# Patient Record
Sex: Female | Born: 1945 | Race: White | Hispanic: No | Marital: Married
Health system: Southern US, Community
[De-identification: ages and names within clinical notes are randomized; demographics above are authoritative.]

---

## 2006-07-15 ENCOUNTER — Ambulatory Visit: Payer: Self-pay

## 2007-07-19 ENCOUNTER — Ambulatory Visit: Payer: Self-pay

## 2010-05-21 ENCOUNTER — Ambulatory Visit: Payer: Self-pay | Admitting: Family Medicine

## 2010-05-27 ENCOUNTER — Ambulatory Visit: Payer: Self-pay | Admitting: Emergency Medicine

## 2012-02-23 ENCOUNTER — Inpatient Hospital Stay: Payer: Self-pay | Admitting: Emergency Medicine

## 2012-02-23 LAB — CBC
HCT: 42.1 % (ref 35.0–47.0)
HGB: 14.5 g/dL (ref 12.0–16.0)
MCH: 31.1 pg (ref 26.0–34.0)
Platelet: 214 10*3/uL (ref 150–440)
RBC: 4.66 10*6/uL (ref 3.80–5.20)
RDW: 13 % (ref 11.5–14.5)

## 2012-02-23 LAB — URINALYSIS, COMPLETE
Bacteria: NONE SEEN
Blood: NEGATIVE
Glucose,UR: NEGATIVE mg/dL (ref 0–75)
Hyaline Cast: 2
Nitrite: NEGATIVE
Ph: 5 (ref 4.5–8.0)
Protein: 30
Specific Gravity: 1.027 (ref 1.003–1.030)
WBC UR: 2 /HPF (ref 0–5)

## 2012-02-23 LAB — CK TOTAL AND CKMB (NOT AT ARMC): CK-MB: 3.2 ng/mL (ref 0.5–3.6)

## 2012-02-23 LAB — COMPREHENSIVE METABOLIC PANEL
Albumin: 4.1 g/dL (ref 3.4–5.0)
Anion Gap: 9 (ref 7–16)
BUN: 20 mg/dL — ABNORMAL HIGH (ref 7–18)
Calcium, Total: 9 mg/dL (ref 8.5–10.1)
Chloride: 102 mmol/L (ref 98–107)
Co2: 29 mmol/L (ref 21–32)
Creatinine: 0.62 mg/dL (ref 0.60–1.30)
EGFR (African American): >60
Osmolality: 284 (ref 275–301)
Potassium: 3 mmol/L — ABNORMAL LOW (ref 3.5–5.1)
SGOT(AST): 35 U/L (ref 15–37)
SGPT (ALT): 33 U/L

## 2012-02-24 LAB — BASIC METABOLIC PANEL
Anion Gap: 10 (ref 7–16)
Calcium, Total: 7.9 mg/dL — ABNORMAL LOW (ref 8.5–10.1)
Chloride: 106 mmol/L (ref 98–107)
Co2: 26 mmol/L (ref 21–32)
Creatinine: 0.8 mg/dL (ref 0.60–1.30)
EGFR (African American): >60
Glucose: 123 mg/dL — ABNORMAL HIGH (ref 65–99)
Potassium: 3.1 mmol/L — ABNORMAL LOW (ref 3.5–5.1)
Sodium: 142 mmol/L (ref 136–145)

## 2012-02-24 LAB — CBC WITH DIFFERENTIAL/PLATELET
Basophil #: 0 10*3/uL (ref 0.0–0.1)
Basophil %: 0.3 %
Eosinophil #: 0 10*3/uL (ref 0.0–0.7)
Eosinophil %: 0.1 %
HGB: 12.6 g/dL (ref 12.0–16.0)
Lymphocyte %: 10.2 %
MCH: 30.8 pg (ref 26.0–34.0)
MCHC: 34.3 g/dL (ref 32.0–36.0)
Monocyte #: 1 x10 3/mm — ABNORMAL HIGH (ref 0.2–0.9)
Monocyte %: 8 %
Neutrophil #: 10.1 10*3/uL — ABNORMAL HIGH (ref 1.4–6.5)
Neutrophil %: 81.4 %

## 2012-02-25 LAB — CBC WITH DIFFERENTIAL/PLATELET
Basophil %: 0.3 %
Eosinophil %: 1.3 %
HCT: 33.2 % — ABNORMAL LOW (ref 35.0–47.0)
HGB: 11.5 g/dL — ABNORMAL LOW (ref 12.0–16.0)
Lymphocyte #: 1.4 10*3/uL (ref 1.0–3.6)
Lymphocyte %: 14.8 %
MCH: 31.3 pg (ref 26.0–34.0)
MCHC: 34.5 g/dL (ref 32.0–36.0)
MCV: 91 fL (ref 80–100)
Neutrophil #: 7 10*3/uL — ABNORMAL HIGH (ref 1.4–6.5)
Neutrophil %: 75.9 %
Platelet: 166 10*3/uL (ref 150–440)
RBC: 3.67 10*6/uL — ABNORMAL LOW (ref 3.80–5.20)
WBC: 9.2 10*3/uL (ref 3.6–11.0)

## 2012-02-25 LAB — BASIC METABOLIC PANEL
BUN: 9 mg/dL (ref 7–18)
Calcium, Total: 8.1 mg/dL — ABNORMAL LOW (ref 8.5–10.1)
Chloride: 113 mmol/L — ABNORMAL HIGH (ref 98–107)
Creatinine: 0.59 mg/dL — ABNORMAL LOW (ref 0.60–1.30)
EGFR (African American): >60
EGFR (Non-African Amer.): >60

## 2012-02-25 LAB — CLOSTRIDIUM DIFFICILE BY PCR

## 2012-02-26 LAB — POTASSIUM: Potassium: 4 mmol/L (ref 3.5–5.1)

## 2012-02-29 LAB — CULTURE, BLOOD (SINGLE)

## 2013-02-08 ENCOUNTER — Ambulatory Visit: Payer: Self-pay

## 2014-04-26 ENCOUNTER — Ambulatory Visit: Payer: Self-pay | Admitting: Family Medicine

## 2014-11-01 ENCOUNTER — Ambulatory Visit: Payer: Self-pay | Admitting: Family Medicine

## 2015-01-13 NOTE — H&P (Signed)
Subjective/Chief Complaint abdominal pain    History of Present Illness pt says rthar she deveolped pain in the right upper quadrent of abdomen for the last 24 hours.she never had this kind of pain before she takes cholesterol lowering medicine and something for her blood pressure.she has vomited at home all day .no bleeding with vomiting or with stools    Past History Hypertension,bladder and kidney infection,Cva Tia also stutters alot and looses the conversation in the middle of talk    Past Medical Health Hypertension   Past Med/Surgical Hx:  Hypercholesterolemia:   htn:   hiatal hernia:   ALLERGIES:  No Known Allergies:     Medications blood pressure pill and cholesterl pill.nurse going to call for medicine from husband   Family and Social History:   Family History Non-Contributory    Social History positive  tobacco, negative ETOH, negative Illicit drugs    + Tobacco Prior (greater than 1 year)    Place of Living Home   Review of Systems:   Fever/Chills No    Cough Yes    Sputum No    Abdominal Pain Yes    Diarrhea Yes    Constipation Yes    Nausea/Vomiting Yes    SOB/DOE No    Chest Pain No    Dysuria No    Tolerating Diet No  Nauseated    Medications/Allergies Reviewed Medications/Allergies reviewed   Physical Exam:   HEENT normal    NECK supple  thyroid not tender  trachea midline    RESP normal resp effort  crackles  some crepitations bilaterally    CARD regular rate  no murmur  no carotid bruits  No LE edema  no JVD    VASCULAR ACCESS none    ABD positive tenderness  no liver/spleen enlargement  no hernia  soft  tenderness right upper quadrent    LYMPH negative neck    EXTR negative cyanosis/clubbing, negative edema    SKIN normal to palpation, No rashes, No ulcers    NEURO alert but stuters alot son says she is like that    [facility] alert, poor insight   Lab Results: Hepatic:  04-Jun-13 12:46    Bilirubin, Total  1.2    Alkaline Phosphatase 111   SGPT (ALT) 33 (12-78 NOTE: NEW REFERENCE RANGE 08/14/2011)   SGOT (AST) 35   Total Protein, Serum 8.2   Albumin, Serum 4.1  Routine Chem:  04-Jun-13 11:26    Lipase 113 (Result(s) reported on 23 Feb 2012 at 01:34PM.)    12:46    Glucose, Serum  131   BUN  20   Creatinine (comp) 0.62   Sodium, Serum 140   Potassium, Serum  3.0   Chloride, Serum 102   CO2, Serum 29   Calcium (Total), Serum 9.0   Osmolality (calc) 284   eGFR (African American) >60   eGFR (Non-African American) >60 (eGFR values <65m/min/1.73 m2 may be an indication of chronic kidney disease (CKD). Calculated eGFR is useful in patients with stable renal function. The eGFR calculation will not be reliable in acutely ill patients when serum creatinine is changing rapidly. It is not useful in  patients on dialysis. The eGFR calculation may not be applicable to patients at the low and high extremes of body sizes, pregnant women, and vegetarians.)   Anion Gap 9  Cardiac:  04-Jun-13 12:46    CK, Total  229   CPK-MB, Serum 3.2 (Result(s) reported on 23 Feb 2012 at 12:28PM.)  Troponin I < 0.02 (0.00-0.05 0.05 ng/mL or less: NEGATIVE  Repeat testing in 3-6 hrs  if clinically indicated. >0.05 ng/mL: POTENTIAL  MYOCARDIAL INJURY. Repeat  testing in 3-6 hrs if  clinically indicated. NOTE: An increase or decrease  of 30% or more on serial  testing suggests a  clinically important change)  Routine UA:  04-Jun-13 11:26    Color (UA) Yellow   Clarity (UA) Cloudy   Glucose (UA) Negative   Bilirubin (UA) Negative   Ketones (UA) Trace   Specific Gravity (UA) 1.027   Blood (UA) Negative   pH (UA) 5.0   Protein (UA) 30 mg/dL   Nitrite (UA) Negative   Leukocyte Esterase (UA) Trace (Result(s) reported on 23 Feb 2012 at 12:13PM.)   RBC (UA) 4 /HPF   WBC (UA) 2 /HPF   Bacteria (UA) NONE SEEN   Epithelial Cells (UA) 2 /HPF   Mucous (UA) PRESENT   Hyaline Cast (UA) 2 /LPF   Amorphous  Crystal (UA) PRESENT (Result(s) reported on 23 Feb 2012 at 12:13PM.)  Routine Hem:  04-Jun-13 12:46    WBC (CBC)  17.9   RBC (CBC) 4.66   Hemoglobin (CBC) 14.5   Hematocrit (CBC) 42.1   Platelet Count (CBC) 214 (Result(s) reported on 23 Feb 2012 at 12:59PM.)   MCV 90   MCH 31.1   MCHC 34.4   RDW 13.0     Assessment/Admission Diagnosis i have talked to radiologist and looked at the CT scan and Discussed the findings .The ct scan shows inflamation around the gall bladder area but gall bladder ultrasound is normal.i do not think it is cholysystitis and i think it is probablt diverticulitis of hepatic flexture    Plan will treat her conservatively with antibiotics ans see how she progresses   Electronic Signatures: Vella Kohler (MD)  (Signed 04-Jun-13 21:44)  Authored: CHIEF COMPLAINT and HISTORY, PAST MEDICAL/SURGIAL HISTORY, ALLERGIES, OTHER MEDICATIONS, FAMILY AND SOCIAL HISTORY, REVIEW OF SYSTEMS, PHYSICAL EXAM, LABS, ASSESSMENT AND PLAN   Last Updated: 04-Jun-13 21:44 by Vella Kohler (MD)

## 2015-01-13 NOTE — Consult Note (Signed)
Chief Complaint:   Subjective/Chief Complaint pt is having diarrhoea today   VITAL SIGNS/ANCILLARY NOTES: **Vital Signs.:   06-Jun-13 06:08   Vital Signs Type Routine   Temperature Temperature (F) 98.3   Celsius 36.8   Temperature Source oral   Pulse Pulse 91   Pulse source per vital sign device   Respirations Respirations 22   Systolic BP Systolic BP 003   Diastolic BP (mmHg) Diastolic BP (mmHg) 76   Mean BP 95   BP Source vital sign device   Pulse Ox % Pulse Ox % 93   Pulse Ox Activity Level  At rest   Oxygen Delivery Room Air/ 21 %  *Intake and Output.:   06-Jun-13 06:51   Grand Totals Intake:  1540 Output:      Net:  7048 88 Hr.:  9169   IV (Primary)      In:  1440   IV (Secondary)      In:  100    Shift 07:00   Grand Totals Intake:  1540 Output:      Net:  4503 88 Hr.:  8280   IV (Primary)      In:  1440   IV (Secondary)      In:  100   Length of Stay Totals Intake:  7960 Output:      Net:  7960    Daily 07:00   Grand Totals Intake:  5810 Output:      Net:  0349 17 Hr.:  9150   IV (Primary)      In:  5082   IV (Secondary)      In:  728   Length of Stay Totals Intake:  7960 Output:      Net:  5697    09:54   Grand Totals Intake:   Output:      Net:   36 Hr.:     Stool  x2 small amount, yellowish-brown, watery   Brief Assessment:   Cardiac Regular    Respiratory normal resp effort    Gastrointestinal Normal    Gastrointestinal details normal Soft  Nontender  No masses palpable  Bowel sounds normal  No rebound tenderness  No gaurding  No rigidity  No organomegaly    Additional Physical Exam pt was put in isolation because of possible c dificille .   Lab Results: Hepatic:  04-Jun-13 12:46    Bilirubin, Total  1.2   Alkaline Phosphatase 111   SGPT (ALT) 33 (12-78 NOTE: NEW REFERENCE RANGE 08/14/2011)   SGOT (AST) 35   Total Protein, Serum 8.2   Albumin, Serum 4.1  Routine Micro:  04-Jun-13 22:26    Micro Text Report BLOOD CULTURE  COMMENT                   NO GROWTH IN 36 HOURS   ANTIBIOTIC                        Culture Comment NO GROWTH IN 36 HOURS  Result(s) reported on 25 Feb 2012 at 06:24AM.  Cardiology:  04-Jun-13 14:30    Ventricular Rate 81   Atrial Rate 81   P-R Interval 146   QRS Duration 90   QT 366   QTc 425   P Axis 57   R Axis 28   T Axis 50   ECG interpretation Normal sinus rhythm Nonspecific T wave abnormality Abnormal ECG No previous ECGs available ----------unconfirmed---------- Confirmed by OVERREAD, NOT (  [id number]), editor PEARSON, BARBARA ([id number]) on 02/24/2012 3:35:51 PM  Routine Chem:  04-Jun-13 11:26    Lipase 113 (Result(s) reported on 23 Feb 2012 at 01:34PM.)    12:46    Glucose, Serum  131   BUN  20   Creatinine (comp) 0.62   Sodium, Serum 140   Potassium, Serum  3.0   Chloride, Serum 102   CO2, Serum 29   Calcium (Total), Serum 9.0   Anion Gap 9   Osmolality (calc) 284   eGFR (African American) >60   eGFR (Non-African American) >60 (eGFR values <74m/min/1.73 m2 may be an indication of chronic kidney disease (CKD). Calculated eGFR is useful in patients with stable renal function. The eGFR calculation will not be reliable in acutely ill patients when serum creatinine is changing rapidly. It is not useful in  patients on dialysis. The eGFR calculation may not be applicable to patients at the low and high extremes of body sizes, pregnant women, and vegetarians.)  05-Jun-13 04:42    Glucose, Serum  123   BUN 15   Creatinine (comp) 0.80   Sodium, Serum 142   Potassium, Serum  3.1   Chloride, Serum 106   CO2, Serum 26   Calcium (Total), Serum  7.9   Anion Gap 10   Osmolality (calc) 285   eGFR (African American) >60   eGFR (Non-African American) >60 (eGFR values <615mmin/1.73 m2 may be an indication of chronic kidney disease (CKD). Calculated eGFR is useful in patients with stable renal function. The eGFR calculation will not be reliable in acutely ill patients when serum  creatinine is changing rapidly. It is not useful in  patients on dialysis. The eGFR calculation may not be applicable to patients at the low and high extremes of body sizes, pregnant women, and vegetarians.)    19:08    Potassium, Serum  3.1 (Result(s) reported on 24 Feb 2012 at 08:08PM.)  06-Jun-13 04:42    Glucose, Serum 86   BUN 9   Creatinine (comp)  0.59   Sodium, Serum 145   Potassium, Serum 4.0   Chloride, Serum  113   CO2, Serum 23   Calcium (Total), Serum  8.1   Anion Gap 9   Osmolality (calc) 287   eGFR (African American) >60   eGFR (Non-African American) >60 (eGFR values <6070min/1.73 m2 may be an indication of chronic kidney disease (CKD). Calculated eGFR is useful in patients with stable renal function. The eGFR calculation will not be reliable in acutely ill patients when serum creatinine is changing rapidly. It is not useful in  patients on dialysis. The eGFR calculation may not be applicable to patients at the low and high extremes of body sizes, pregnant women, and vegetarians.)  Cardiac:  04-Jun-13 12:46    CK, Total  229   CPK-MB, Serum 3.2 (Result(s) reported on 23 Feb 2012 at 12:28PM.)   Troponin I < 0.02 (0.00-0.05 0.05 ng/mL or less: NEGATIVE  Repeat testing in 3-6 hrs  if clinically indicated. >0.05 ng/mL: POTENTIAL  MYOCARDIAL INJURY. Repeat  testing in 3-6 hrs if  clinically indicated. NOTE: An increase or decrease  of 30% or more on serial  testing suggests a  clinically important change)  Routine UA:  04-Jun-13 11:26    Color (UA) Yellow   Clarity (UA) Cloudy   Glucose (UA) Negative   Bilirubin (UA) Negative   Ketones (UA) Trace   Specific Gravity (UA) 1.027   Blood (UA) Negative   pH (UA) 5.0  Protein (UA) 30 mg/dL   Nitrite (UA) Negative   Leukocyte Esterase (UA) Trace (Result(s) reported on 23 Feb 2012 at 12:13PM.)   RBC (UA) 4 /HPF   WBC (UA) 2 /HPF   Bacteria (UA) NONE SEEN   Epithelial Cells (UA) 2 /HPF   Mucous (UA)  PRESENT   Hyaline Cast (UA) 2 /LPF   Amorphous Crystal (UA) PRESENT (Result(s) reported on 23 Feb 2012 at 12:13PM.)  Routine Hem:  04-Jun-13 12:46    WBC (CBC)  17.9   RBC (CBC) 4.66   Hemoglobin (CBC) 14.5   Hematocrit (CBC) 42.1   Platelet Count (CBC) 214 (Result(s) reported on 23 Feb 2012 at 12:59PM.)   MCV 90   MCH 31.1   MCHC 34.4   RDW 13.0  05-Jun-13 04:42    WBC (CBC)  12.4   RBC (CBC) 4.07   Hemoglobin (CBC) 12.6   Hematocrit (CBC) 36.6   Platelet Count (CBC) 197   MCV 90   MCH 30.8   MCHC 34.3   RDW 13.0   Neutrophil % 81.4   Lymphocyte % 10.2   Monocyte % 8.0   Eosinophil % 0.1   Basophil % 0.3   Neutrophil #  10.1   Lymphocyte # 1.3   Monocyte #  1.0   Eosinophil # 0.0   Basophil # 0.0 (Result(s) reported on 24 Feb 2012 at 05:18AM.)  06-Jun-13 04:42    WBC (CBC) 9.2   RBC (CBC)  3.67   Hemoglobin (CBC)  11.5   Hematocrit (CBC)  33.2   Platelet Count (CBC) 166   MCV 91   MCH 31.3   MCHC 34.5   RDW 13.4   Neutrophil % 75.9   Lymphocyte % 14.8   Monocyte % 7.7   Eosinophil % 1.3   Basophil % 0.3   Neutrophil #  7.0   Lymphocyte # 1.4   Monocyte # 0.7   Eosinophil # 0.1   Basophil # 0.0 (Result(s) reported on 25 Feb 2012 at 05:28AM.)   Assessment/Plan:  Assessment/Plan:   Plan will conrtinue same treatment till culture come back WBC back to normal   Electronic Signatures: Vella Kohler (MD)  (Signed 06-Jun-13 12:38)  Authored: Chief Complaint, VITAL SIGNS/ANCILLARY NOTES, Brief Assessment, Lab Results, Assessment/Plan   Last Updated: 06-Jun-13 12:38 by Vella Kohler (MD)

## 2015-01-13 NOTE — Consult Note (Signed)
PATIENT NAMMarland Weaver:  [name], Sheena MR#:  [id number] DATE OF BIRTH:  06-02-1946  DATE OF CONSULTATION:  02/24/2012  REFERRING PHYSICIAN:  Dr. Cecelia ByarsHashmi  CONSULTING PHYSICIAN:  Dietrick Barris H. Allena KatzPatel, MD  PRIMARY MD: Ladona RidgelJarrod D. Kanady, FNP  REASON FOR CONSULTATION: Opinion regarding the patient's hypokalemia, hypertension, and hyperlipidemia.   CHIEF COMPLAINT: Abdominal pain.   HISTORY OF PRESENT ILLNESS: The patient is a 69 year old white female with history of hypertension and elevated cholesterol who presents with complaint of having right upper chest pain as well as right upper quadrant abdominal pain as well as right lower quadrant abdominal pain ongoing for 24 hours prior to admission. The patient was seen in the ED and had evaluation. She was noted to have an elevated WBC count with a CT scan suggestive of initial inflammation around the gallbladder. However, right upper quadrant ultrasound failed to show any gallbladder wall thickening. It is felt that she likely has either colitis or diverticulitis involving the hepatic flexure. The patient reports that her pain is improved. She also was nauseous yesterday and had some throwing up. All these symptoms have improved. She denies any fevers or chills at home. She denies any left-sided chest pain. She does report similar type of episode previously. Denies any urinary frequency, urgency, or hesitancy. Denies any headaches or photophobia.   PAST MEDICAL HISTORY:  1. Hypertension.  2. Hyperlipidemia.  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  4. History of hiatal hernia.   ALLERGIES: None.   SOCIAL HISTORY: Does not smoke. Does not drink. No drugs.   FAMILY HISTORY: Positive for hypertension.   REVIEW OF SYSTEMS: CONSTITUTIONAL: Denies any fevers, fatigue, weakness. Presented with pain in the abdomen. No weight loss. No weight gain. EYES: No blurred or double vision. No pain. No redness. No inflammation. No glaucoma. No cataracts. ENT: No tinnitus. No ear pain. No  hearing loss. No epistaxis. No nasal discharge. RESPIRATORY: No cough. No wheezing. No hemoptysis. No COPD. No TB. CARDIOVASCULAR: Denies any left-sided chest pain. No orthopnea. No edema. Does have high blood pressure. GI: Nausea and vomiting as above. Abdominal pain as above. No hematemesis. No melena. No gastroesophageal reflux disease. No IBS. GU: Denies any dysuria, hematuria, renal calculus, or frequency. ENDOCRINE: Denies any polyuria, nocturia, or thyroid problems. Denies any increase in sweating, heat or cold intolerance. HEME/LYMPH: Denies anemia, easy bruisability, or bleeding. SKIN: No acne. No rash. No change in mole, hair or skin. MUSCULOSKELETAL: Denies any pain in the neck, back, or shoulder. NEUROLOGIC: No numbness. No weakness. No CVA. No TIA. Has chronic stuttering. PSYCHIATRIC: Denies any anxiety, insomnia, or ADD.   PHYSICAL EXAMINATION:   VITAL SIGNS: Temperature 98.1, pulse 74, respirations 18, blood pressure 111/70, O2 95%.   GENERAL: The patient appears comfortable in no acute distress.   HEENT: Pupils equally round and reactive to light and accommodation. Extraocular movements intact. There is no conjunctival pallor. No scleral icterus. Nasal exam shows no ulceration or drainage. Oropharynx is clear without any exudates.   NECK: No thyromegaly. No carotid bruits.   CARDIOVASCULAR: Regular rate and rhythm. No murmurs, rubs, clicks, or gallops. PMI is not displaced.   LUNGS: Clear to auscultation bilaterally without any rales, rhonchi, or wheezing.   ABDOMEN: There is mild distention without any guarding. No rebound. Positive diminished breath sounds. No hepatosplenomegaly.   EXTREMITIES: No clubbing, cyanosis, or edema.   SKIN: No rash.   LYMPHATICS: No lymph nodes palpable.   VASCULAR: Good DP, PT pulses.   PSYCHIATRIC: Not anxious or  depressed.  NEUROLOGIC: Awake, alert, oriented x3. No focal deficits.   LABORATORY, DIAGNOSTIC, AND RADIOLOGICAL DATA: WBC today  12.4, down from 17.9 yesterday, hemoglobin 12.6, platelets 197, glucose 123, BUN 15, creatinine 0.80, sodium 142, potassium 3.1, chloride 106, CO2 26, calcium 7.9.   CT scan of the abdomen shows mild distention of the gallbladder with mild stranding adjacent to gallbladder and adjacent hepatic flexures. Small amount of free intraperitoneal fluid. Small air noted in the gallbladder. Endometrial thickening and endometrial process such as endometrial cancer cannot be excluded.  Right upper quadrant ultrasound shows no gallstones or other acute changes noted.   CPK 229. CK-MB 3.2. Troponin less than 0.02. Lipase 113.   ASSESSMENT AND PLAN: The patient is a 69 year old white female admitted with abdominal pain for 24 hours for possible colitis, hepatic flexure diverticulitis. We are asked to see patient regarding hypertension, elevated cholesterol, hypokalemia.  1. Hypokalemia. Has gotten IV KCl. Will repeat potassium later today. If low, will replace. Likely due to HCTZ therapy as well as some nausea and vomiting. Will repeat BMP in the a.m.  2. Hypertension. Blood pressure is currently normal. Will hold HCTZ.  3. Hyperlipidemia. Hold simvastatin until able to take adequate p.o.  4. Endometrial thickening. Recommend outpatient GYN evaluation.  5. Miscellaneous. Recommend incentive spirometry and DVT prophylaxis while in the hospital.   TIME SPENT: 40 minutes.   ____________________________ Lacie Scotts Allena Katz, MD shp:drc D: 02/24/2012 12:47:00 ET T: 02/24/2012 13:12:59 ET JOB#: [id number] cc: Richanda Darin H. Allena Katz, MD, <Dictator>, Ladona Ridgel, FNP Charise Carwin MD ELECTRONICALLY SIGNED 02/29/2012 8:03

## 2015-01-13 NOTE — Consult Note (Signed)
Chief Complaint:   Subjective/Chief Complaint feeling better   VITAL SIGNS/ANCILLARY NOTES: **Vital Signs.:   05-Jun-13 01:23   Vital Signs Type Q 4hr   Temperature Temperature (F) 99.2   Celsius 37.3   Temperature Source oral   Pulse Pulse 88   Pulse source per Dinamap   Respirations Respirations 18   Systolic BP Systolic BP 809   Diastolic BP (mmHg) Diastolic BP (mmHg) 67   Mean BP 78   BP Source vital sign device   Pulse Ox % Pulse Ox % 90   Pulse Ox Activity Level  At rest   Oxygen Delivery Room Air/ 21 %    05:45   Vital Signs Type Routine   Temperature Temperature (F) 99.8   Celsius 37.6   Temperature Source oral   Pulse Pulse 102   Pulse source per Dinamap   Respirations Respirations 18   Systolic BP Systolic BP 983   Diastolic BP (mmHg) Diastolic BP (mmHg) 70   Mean BP 86   BP Source vital sign device   Pulse Ox % Pulse Ox % 90   Pulse Ox Activity Level  At rest   Oxygen Delivery Room Air/ 21 %    09:44   Temperature Temperature (F) 98.1   Celsius 36.7   Temperature Source oral   Pulse Pulse 74   Respirations Respirations 18   Systolic BP Systolic BP 382   Diastolic BP (mmHg) Diastolic BP (mmHg) 70   Mean BP 83   Pulse Ox % Pulse Ox % 95   Pulse Ox Activity Level  At rest   Oxygen Delivery Room Air/ 21 %    14:50   Temperature Temperature (F) 98.4   Celsius 36.8   Temperature Source oral   Pulse Pulse 79   Respirations Respirations 20   Systolic BP Systolic BP 505   Diastolic BP (mmHg) Diastolic BP (mmHg) 71   Mean BP 84   Pulse Ox % Pulse Ox % 92   Pulse Ox Activity Level  At rest   Oxygen Delivery Room Air/ 21 %  *Intake and Output.:   05-Jun-13 06:50   Grand Totals Intake:  2150 Output:      Net:  2150 24 Hr.:  2150   IV (Primary)      In:  2050   IV (Secondary)      In:  100    Shift 07:00   Grand Totals Intake:  2150 Output:      Net:  2150 24 Hr.:  2150   IV (Primary)      In:  2050   IV (Secondary)      In:  100   Length of  Stay Totals Intake:  2150 Output:      Net:  2150    Daily 07:00   Grand Totals Intake:  2150 Output:      Net:  2150 24 Hr.:  2150   IV (Primary)      In:  2050   IV (Secondary)      In:  100   Length of Stay Totals Intake:  2150 Output:      Net:  2150    13:18   Grand Totals Intake:   Output:      Net:   82 Hr.:     Urinary Method  Void; Up to BR   Unmeasured Output  Void; X2 per pt   Brief Assessment:   Cardiac Regular    Respiratory normal  resp effort    Gastrointestinal Normal    Gastrointestinal details normal Soft  No masses palpable  Bowel sounds normal  No rebound tenderness  No gaurding  No rigidity    Additional Physical Exam feeling much better hungry wants some fluids   Lab Results: Hepatic:  04-Jun-13 12:46    Bilirubin, Total  1.2   Alkaline Phosphatase 111   SGPT (ALT) 33 (12-78 NOTE: NEW REFERENCE RANGE 08/14/2011)   SGOT (AST) 35   Total Protein, Serum 8.2   Albumin, Serum 4.1  Routine Micro:  04-Jun-13 22:26    Micro Text Report BLOOD CULTURE   COMMENT                   NO GROWTH IN 8-12 HOURS   ANTIBIOTIC                        Culture Comment NO GROWTH IN 8-12 HOURS  Result(s) reported on 24 Feb 2012 at 07:43AM.  Cardiology:  04-Jun-13 14:30    Ventricular Rate 81   Atrial Rate 81   P-R Interval 146   QRS Duration 90   QT 366   QTc 425   P Axis 57   R Axis 28   T Axis 50   ECG interpretation Normal sinus rhythm Nonspecific T wave abnormality Abnormal ECG No previous ECGs available ----------unconfirmed---------- Confirmed by OVERREAD, NOT ([id number]), editor PEARSON, BARBARA ([id number]) on 02/24/2012 3:35:51 PM  Routine Chem:  04-Jun-13 11:26    Lipase 113 (Result(s) reported on 23 Feb 2012 at 01:34PM.)    12:46    Glucose, Serum  131   BUN  20   Creatinine (comp) 0.62   Sodium, Serum 140   Potassium, Serum  3.0   Chloride, Serum 102   CO2, Serum 29   Calcium (Total), Serum 9.0   Anion Gap 9   Osmolality (calc) 284   eGFR  (African American) >60   eGFR (Non-African American) >60 (eGFR values <35m/min/1.73 m2 may be an indication of chronic kidney disease (CKD). Calculated eGFR is useful in patients with stable renal function. The eGFR calculation will not be reliable in acutely ill patients when serum creatinine is changing rapidly. It is not useful in  patients on dialysis. The eGFR calculation may not be applicable to patients at the low and high extremes of body sizes, pregnant women, and vegetarians.)  05-Jun-13 04:42    Glucose, Serum  123   BUN 15   Creatinine (comp) 0.80   Sodium, Serum 142   Potassium, Serum  3.1   Chloride, Serum 106   CO2, Serum 26   Calcium (Total), Serum  7.9   Anion Gap 10   Osmolality (calc) 285   eGFR (African American) >60   eGFR (Non-African American) >60 (eGFR values <660mmin/1.73 m2 may be an indication of chronic kidney disease (CKD). Calculated eGFR is useful in patients with stable renal function. The eGFR calculation will not be reliable in acutely ill patients when serum creatinine is changing rapidly. It is not useful in  patients on dialysis. The eGFR calculation may not be applicable to patients at the low and high extremes of body sizes, pregnant women, and vegetarians.)  Cardiac:  04-Jun-13 12:46    CK, Total  229   CPK-MB, Serum 3.2 (Result(s) reported on 23 Feb 2012 at 12:28PM.)   Troponin I < 0.02 (0.00-0.05 0.05 ng/mL or less: NEGATIVE  Repeat testing in 3-6 hrs  if  clinically indicated. >0.05 ng/mL: POTENTIAL  MYOCARDIAL INJURY. Repeat  testing in 3-6 hrs if  clinically indicated. NOTE: An increase or decrease  of 30% or more on serial  testing suggests a  clinically important change)  Routine UA:  04-Jun-13 11:26    Color (UA) Yellow   Clarity (UA) Cloudy   Glucose (UA) Negative   Bilirubin (UA) Negative   Ketones (UA) Trace   Specific Gravity (UA) 1.027   Blood (UA) Negative   pH (UA) 5.0   Protein (UA) 30 mg/dL   Nitrite  (UA) Negative   Leukocyte Esterase (UA) Trace (Result(s) reported on 23 Feb 2012 at 12:13PM.)   RBC (UA) 4 /HPF   WBC (UA) 2 /HPF   Bacteria (UA) NONE SEEN   Epithelial Cells (UA) 2 /HPF   Mucous (UA) PRESENT   Hyaline Cast (UA) 2 /LPF   Amorphous Crystal (UA) PRESENT (Result(s) reported on 23 Feb 2012 at 12:13PM.)  Routine Hem:  04-Jun-13 12:46    WBC (CBC)  17.9   RBC (CBC) 4.66   Hemoglobin (CBC) 14.5   Hematocrit (CBC) 42.1   Platelet Count (CBC) 214 (Result(s) reported on 23 Feb 2012 at 12:59PM.)   MCV 90   MCH 31.1   MCHC 34.4   RDW 13.0  05-Jun-13 04:42    WBC (CBC)  12.4   RBC (CBC) 4.07   Hemoglobin (CBC) 12.6   Hematocrit (CBC) 36.6   Platelet Count (CBC) 197   MCV 90   MCH 30.8   MCHC 34.3   RDW 13.0   Neutrophil % 81.4   Lymphocyte % 10.2   Monocyte % 8.0   Eosinophil % 0.1   Basophil % 0.3   Neutrophil #  10.1   Lymphocyte # 1.3   Monocyte #  1.0   Eosinophil # 0.0   Basophil # 0.0 (Result(s) reported on 24 Feb 2012 at 05:18AM.)   Assessment/Plan:  Assessment/Plan:   Assessment feeling better    Plan clear liquids up and about  continue antibiotics   Electronic Signatures: Vella Kohler (MD)  (Signed 05-Jun-13 16:18)  Authored: Chief Complaint, VITAL SIGNS/ANCILLARY NOTES, Brief Assessment, Lab Results, Assessment/Plan   Last Updated: 05-Jun-13 16:18 by Vella Kohler (MD)

## 2015-01-13 NOTE — Discharge Summary (Signed)
PATIENT NAME:  Sheena Weaver, [name] MR#:  [id number] DATE OF BIRTH:  11-03-1945  DATE OF ADMISSION:  02/23/2012 DATE OF DISCHARGE:  02/26/2012  HOSPITAL COURSE: This patient was admitted in the hospital on 02/23/2012 with abdominal pain in the right upper quadrant of the abdomen through the Emergency Room. She had this pain for about one day duration. She had a CAT scan of the abdomen and pelvis done and it showed inflammation around the gallbladder so emergency physician thought that she might have acute cholecystitis. When they called me I ordered an ultrasound of the gallbladder which was basically normal. No stones were seen in the gallbladder and the gallbladder wall was normal. I discussed this case with the radiologist and he told me it's probably diverticulitis of the hepatic flexure area rather than anything else so I put this patient on antibiotics with Zosyn. She improved considerably on this antibiotic. She was then discharged from the hospital on 02/26/2012 in satisfactory condition. Her abdomen was soft and nontender. During the hospital course her bilirubin was slightly up but alkaline phosphatase was normal and everything else was normal. The patient will be followed in my office. I'm going to put her on Amoxil 875 mg twice a day for 10 days and see her in the office in a week. Then she will need an EGD and colonoscopy later on to rule out any pathology in the stomach and the colon.   ____________________________ Alton RevereMasud S. Cecelia ByarsHashmi, MD msh:drc D: 02/26/2012 14:10:18 ET T: 02/26/2012 16:24:09 ET JOB#: [id number]  cc: Meg Niemeier S. Cecelia ByarsHashmi, MD, <Dictator> Meryle ReadyMASUD S Colum Colt MD ELECTRONICALLY SIGNED 03/02/2012 13:07

## 2015-12-18 ENCOUNTER — Other Ambulatory Visit: Payer: Self-pay | Admitting: Family Medicine

## 2015-12-18 DIAGNOSIS — Z1231 Encounter for screening mammogram for malignant neoplasm of breast: Secondary | ICD-10-CM

## 2017-01-15 ENCOUNTER — Other Ambulatory Visit: Payer: Self-pay | Admitting: Family Medicine

## 2017-01-15 DIAGNOSIS — Z1231 Encounter for screening mammogram for malignant neoplasm of breast: Secondary | ICD-10-CM

## 2017-01-18 ENCOUNTER — Other Ambulatory Visit: Payer: Self-pay | Admitting: Family Medicine

## 2017-01-18 DIAGNOSIS — Z13820 Encounter for screening for osteoporosis: Secondary | ICD-10-CM

## 2017-03-04 ENCOUNTER — Ambulatory Visit
Admission: RE | Admit: 2017-03-04 | Discharge: 2017-03-04 | Disposition: A | Discharge status: Home / Self Care | Payer: Medicare HMO | Source: Ambulatory Visit | Attending: Family Medicine | Admitting: Family Medicine

## 2017-03-04 ENCOUNTER — Encounter (HOSPITAL_COMMUNITY): Payer: Self-pay

## 2017-03-04 DIAGNOSIS — M85851 Other specified disorders of bone density and structure, right thigh: Secondary | ICD-10-CM | POA: Diagnosis not present

## 2017-03-04 DIAGNOSIS — Z1231 Encounter for screening mammogram for malignant neoplasm of breast: Secondary | ICD-10-CM | POA: Insufficient documentation

## 2017-03-04 DIAGNOSIS — M8588 Other specified disorders of bone density and structure, other site: Secondary | ICD-10-CM | POA: Diagnosis not present

## 2017-03-04 DIAGNOSIS — Z13820 Encounter for screening for osteoporosis: Secondary | ICD-10-CM | POA: Insufficient documentation

## 2017-03-05 ENCOUNTER — Other Ambulatory Visit: Payer: Self-pay | Admitting: Family Medicine

## 2017-03-05 DIAGNOSIS — R928 Other abnormal and inconclusive findings on diagnostic imaging of breast: Secondary | ICD-10-CM

## 2017-03-05 DIAGNOSIS — N6489 Other specified disorders of breast: Secondary | ICD-10-CM

## 2017-03-19 ENCOUNTER — Ambulatory Visit: Payer: Medicare HMO

## 2019-06-20 ENCOUNTER — Other Ambulatory Visit: Payer: Self-pay | Admitting: Family Medicine

## 2019-06-20 DIAGNOSIS — Z1231 Encounter for screening mammogram for malignant neoplasm of breast: Secondary | ICD-10-CM

## 2019-06-20 DIAGNOSIS — M8580 Other specified disorders of bone density and structure, unspecified site: Secondary | ICD-10-CM

## 2020-07-02 ENCOUNTER — Other Ambulatory Visit: Payer: Self-pay | Admitting: Family Medicine

## 2020-07-02 DIAGNOSIS — Z1231 Encounter for screening mammogram for malignant neoplasm of breast: Secondary | ICD-10-CM

## 2021-01-01 ENCOUNTER — Other Ambulatory Visit: Payer: Self-pay | Admitting: Family Medicine

## 2021-01-01 DIAGNOSIS — Z1231 Encounter for screening mammogram for malignant neoplasm of breast: Secondary | ICD-10-CM

## 2021-01-01 DIAGNOSIS — Z13820 Encounter for screening for osteoporosis: Secondary | ICD-10-CM

## 2021-02-05 ENCOUNTER — Other Ambulatory Visit: Payer: Medicare HMO

## 2021-02-19 ENCOUNTER — Other Ambulatory Visit: Payer: Self-pay | Admitting: Family Medicine

## 2021-02-19 DIAGNOSIS — N6489 Other specified disorders of breast: Secondary | ICD-10-CM

## 2021-03-05 ENCOUNTER — Other Ambulatory Visit: Payer: Self-pay

## 2021-03-05 ENCOUNTER — Ambulatory Visit
Admission: RE | Admit: 2021-03-05 | Discharge: 2021-03-05 | Disposition: A | Discharge status: Home / Self Care | Payer: Medicare HMO | Source: Ambulatory Visit | Attending: Family Medicine | Admitting: Family Medicine

## 2021-03-05 DIAGNOSIS — N6489 Other specified disorders of breast: Secondary | ICD-10-CM | POA: Diagnosis present

## 2021-03-05 DIAGNOSIS — Z780 Asymptomatic menopausal state: Secondary | ICD-10-CM | POA: Diagnosis not present

## 2021-03-05 DIAGNOSIS — Z13820 Encounter for screening for osteoporosis: Secondary | ICD-10-CM | POA: Diagnosis present

## 2021-11-07 IMAGING — MG DIGITAL DIAGNOSTIC BILAT W/ TOMO W/ CAD
6 of 12 series · 6 of 36 positions shown · non-contrast
Comparison: Previous exam(s).

CLINICAL DATA: Bilateral asymmetries in 1936

EXAM:
DIGITAL DIAGNOSTIC BILATERAL MAMMOGRAM WITH TOMOSYNTHESIS AND CAD
TECHNIQUE: Bilateral digital diagnostic mammography and breast tomosynthesis
was performed. The images were evaluated with computer-aided
detection.

[R MLO synth-2D (1 of 2)]
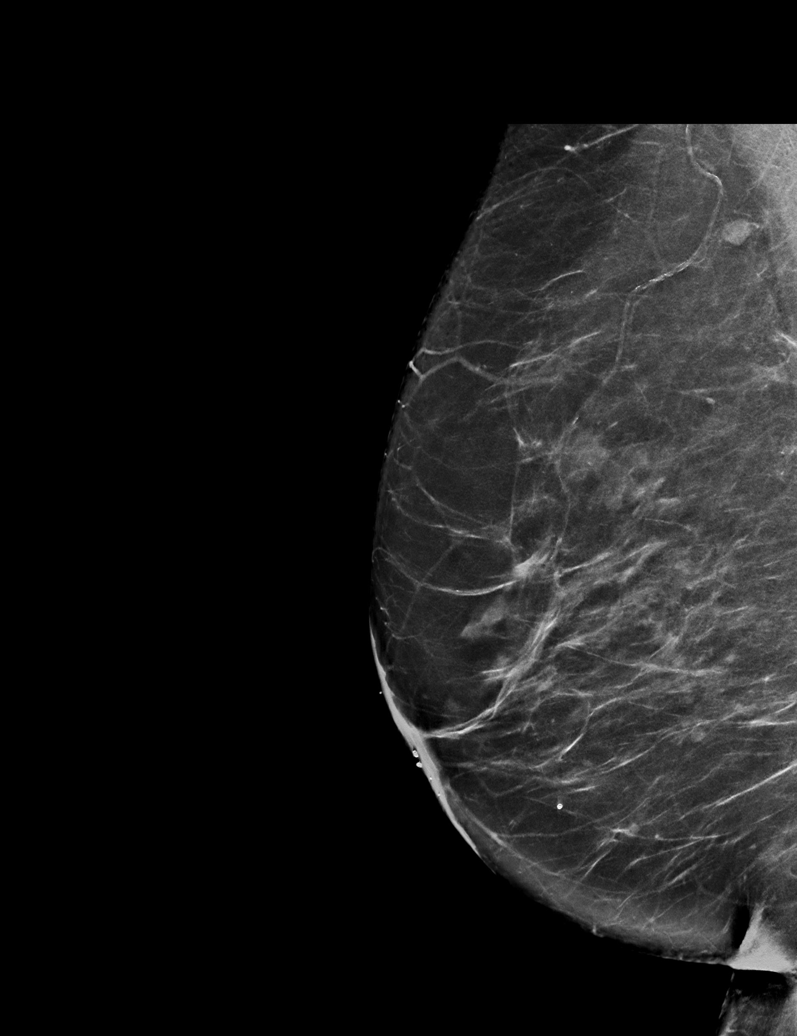

[L MLO synth-2D]
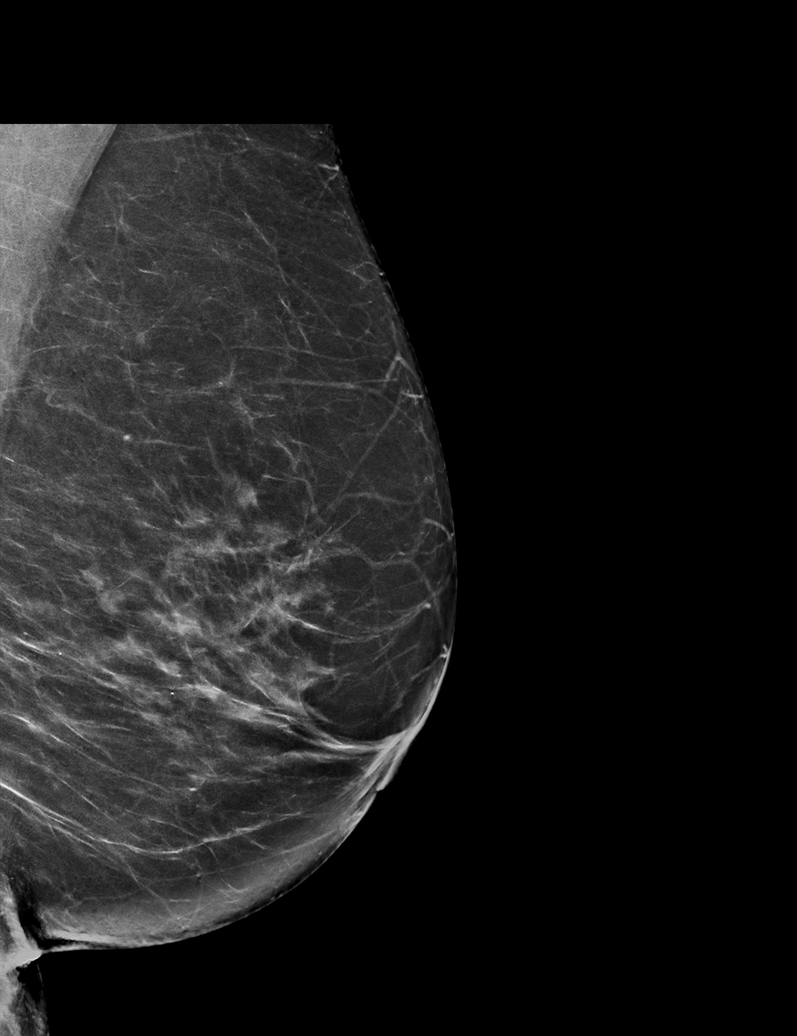

[R MLO synth-2D (2 of 2)]
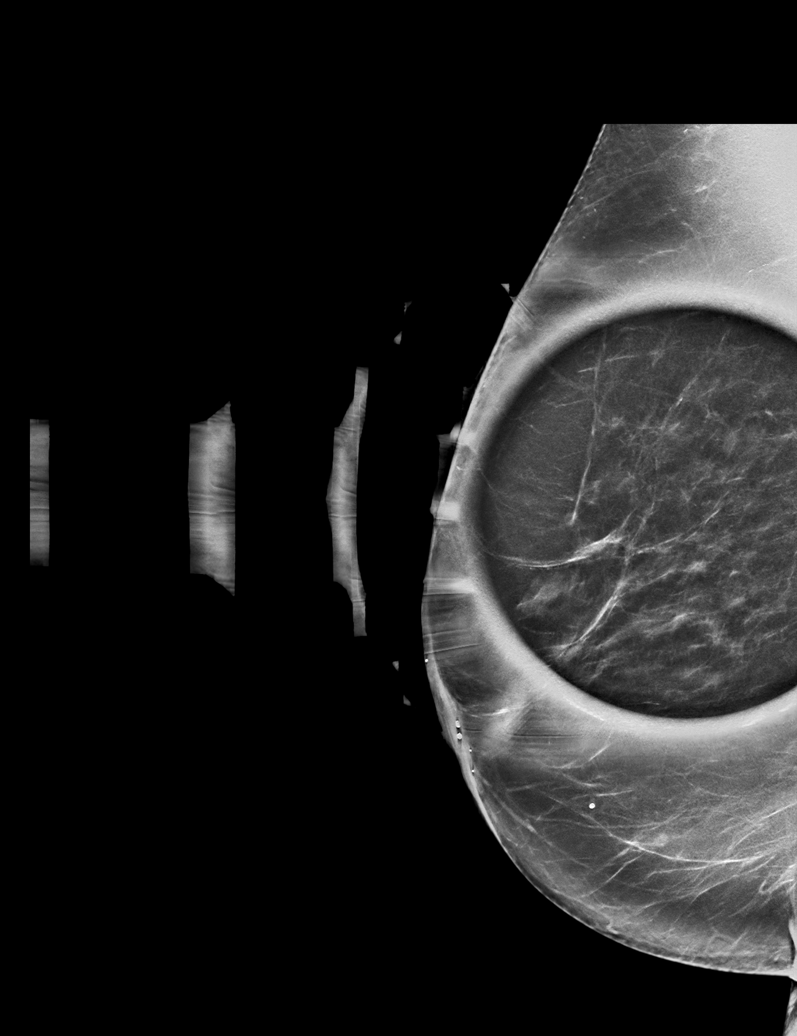

[L CC synth-2D]
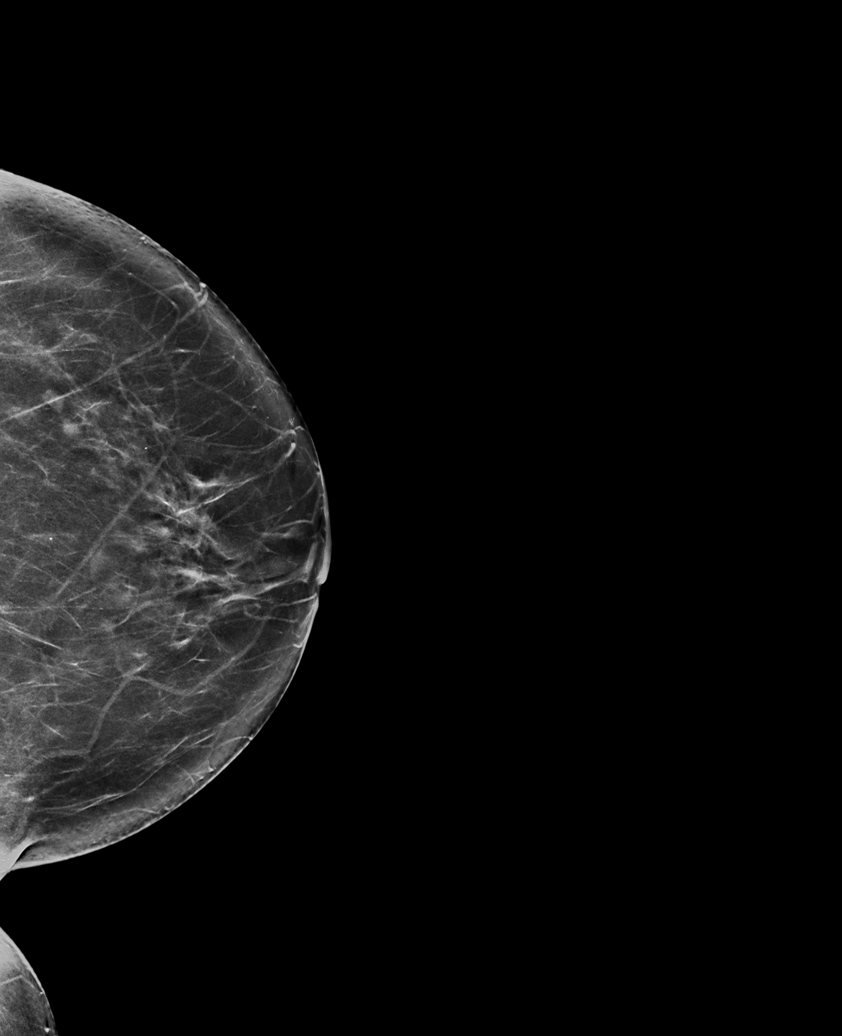

[R CC synth-2D (1 of 2)]
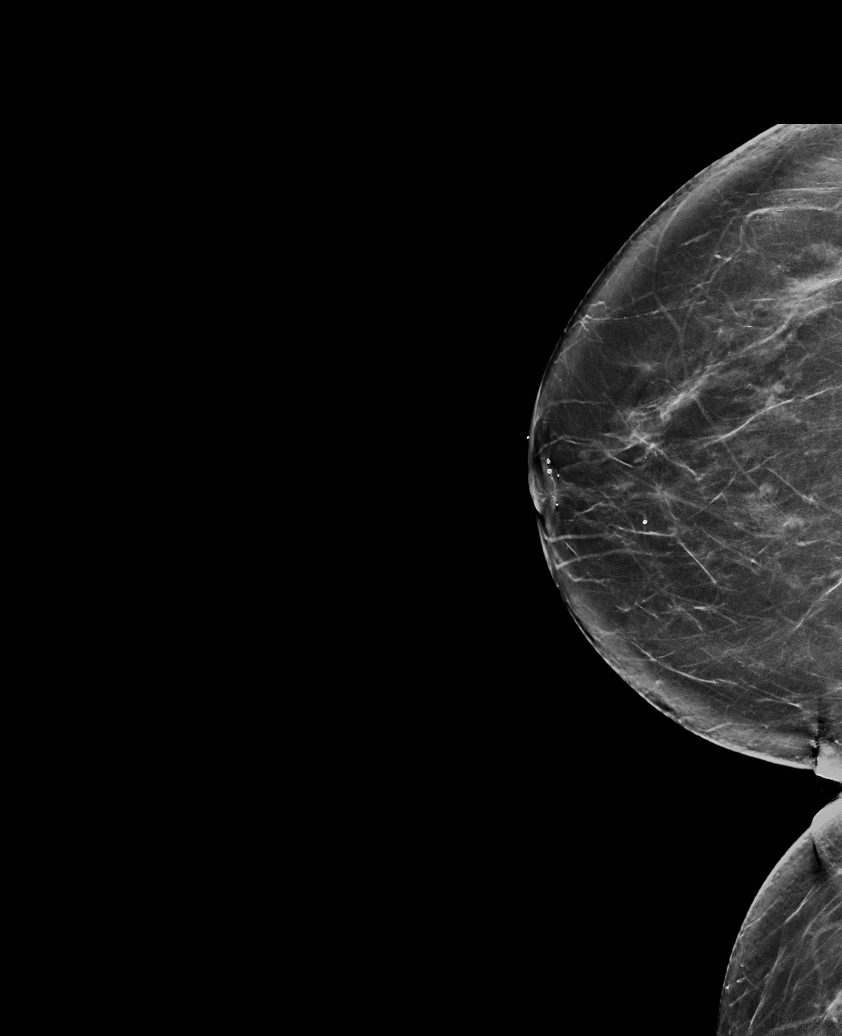

[R CC synth-2D (2 of 2)]
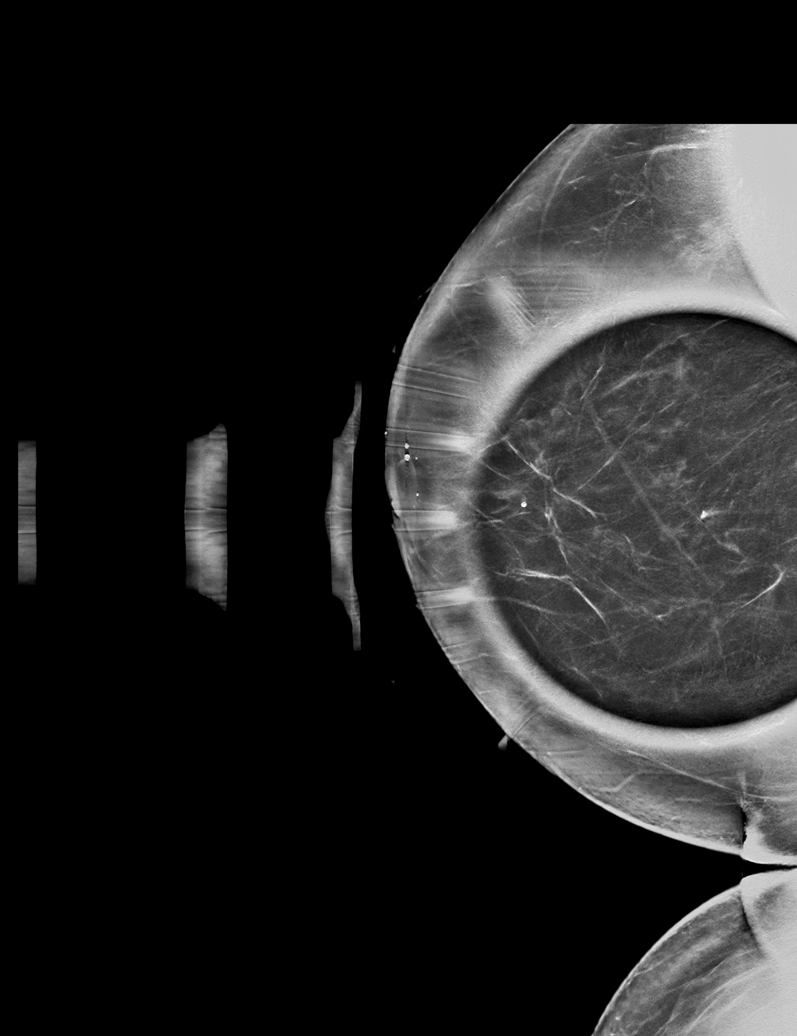

[6 of 36 positions shown; findings below may reference images not displayed]

ACR Breast Density Category b: There are scattered areas of
fibroglandular density.
FINDINGS: LEFT breast:

The previously described finding does not persist with additional
views, consistent with superimposed fibroglandular tissue. No
suspicious mass, microcalcification, or other finding is identified.
There are multiple bilateral round and oval circumscribed masses,
most consistent with benign fibroadenomas or cysts.

RIGHT breast:

The previously described finding does not persist with additional
views, consistent with superimposed fibroglandular tissue. No
suspicious mass, microcalcification, or other finding is identified.
IMPRESSION: No mammographic evidence of malignancy bilaterally.

RECOMMENDATION:
Screening mammogram in one year.(Code:5A-6-51I)

I have discussed the findings and recommendations with the patient.
If applicable, a reminder letter will be sent to the patient
regarding the next appointment.

BI-RADS CATEGORY  2: Benign.

## 2022-02-25 ENCOUNTER — Other Ambulatory Visit: Payer: Self-pay | Admitting: Family Medicine

## 2022-02-25 DIAGNOSIS — Z1231 Encounter for screening mammogram for malignant neoplasm of breast: Secondary | ICD-10-CM

## 2022-07-20 ENCOUNTER — Ambulatory Visit
Admission: RE | Admit: 2022-07-20 | Discharge: 2022-07-20 | Disposition: A | Discharge status: Home / Self Care | Payer: Medicare HMO | Source: Ambulatory Visit | Attending: Family Medicine | Admitting: Family Medicine

## 2022-07-20 ENCOUNTER — Other Ambulatory Visit: Payer: Self-pay | Admitting: Family Medicine

## 2022-07-20 ENCOUNTER — Ambulatory Visit
Admission: RE | Admit: 2022-07-20 | Discharge: 2022-07-20 | Disposition: A | Discharge status: Home / Self Care | Payer: Medicare HMO | Attending: Family Medicine | Admitting: Family Medicine

## 2022-07-20 DIAGNOSIS — M25511 Pain in right shoulder: Secondary | ICD-10-CM

## 2022-07-20 DIAGNOSIS — M25512 Pain in left shoulder: Secondary | ICD-10-CM | POA: Diagnosis present
